# Patient Record
Sex: Male | Born: 2000 | Race: Black or African American | Hispanic: No | Marital: Single | Smoking: Never smoker
Health system: Southern US, Community
[De-identification: ages and names within clinical notes are randomized; demographics above are authoritative.]

---

## 2019-04-17 ENCOUNTER — Emergency Department (HOSPITAL_BASED_OUTPATIENT_CLINIC_OR_DEPARTMENT_OTHER): Payer: Medicaid Other

## 2019-04-17 ENCOUNTER — Other Ambulatory Visit: Payer: Self-pay

## 2019-04-17 ENCOUNTER — Encounter (HOSPITAL_BASED_OUTPATIENT_CLINIC_OR_DEPARTMENT_OTHER): Payer: Self-pay

## 2019-04-17 ENCOUNTER — Emergency Department (HOSPITAL_BASED_OUTPATIENT_CLINIC_OR_DEPARTMENT_OTHER)
Admission: EM | Admit: 2019-04-17 | Discharge: 2019-04-17 | Disposition: A | Discharge status: Home / Self Care | Payer: Medicaid Other | Attending: Emergency Medicine | Admitting: Emergency Medicine

## 2019-04-17 DIAGNOSIS — Y9371 Activity, boxing: Secondary | ICD-10-CM | POA: Diagnosis not present

## 2019-04-17 DIAGNOSIS — Y929 Unspecified place or not applicable: Secondary | ICD-10-CM | POA: Diagnosis not present

## 2019-04-17 DIAGNOSIS — W228XXA Striking against or struck by other objects, initial encounter: Secondary | ICD-10-CM | POA: Insufficient documentation

## 2019-04-17 DIAGNOSIS — S6991XA Unspecified injury of right wrist, hand and finger(s), initial encounter: Secondary | ICD-10-CM | POA: Diagnosis present

## 2019-04-17 DIAGNOSIS — Y998 Other external cause status: Secondary | ICD-10-CM | POA: Diagnosis not present

## 2019-04-17 DIAGNOSIS — S63641A Sprain of metacarpophalangeal joint of right thumb, initial encounter: Secondary | ICD-10-CM | POA: Insufficient documentation
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
## 2019-04-17 NOTE — Discharge Instructions (Signed)
Take ibuprofen 3 times a day with meals.  Do not take other anti-inflammatories at the same time (Advil, Motrin, naproxen, Aleve). You may supplement with Tylenol if you need further pain control. Use ice packs to help with pain.  Return to the emergency room with any new, worsening, concerning symptoms.

## 2019-10-11 ENCOUNTER — Encounter (HOSPITAL_BASED_OUTPATIENT_CLINIC_OR_DEPARTMENT_OTHER): Payer: Self-pay

## 2019-10-11 ENCOUNTER — Emergency Department (HOSPITAL_BASED_OUTPATIENT_CLINIC_OR_DEPARTMENT_OTHER)
Admission: EM | Admit: 2019-10-11 | Discharge: 2019-10-11 | Disposition: A | Discharge status: Home / Self Care | Payer: Medicaid Other | Attending: Emergency Medicine | Admitting: Emergency Medicine

## 2019-10-11 ENCOUNTER — Other Ambulatory Visit: Payer: Self-pay

## 2019-10-11 DIAGNOSIS — H7292 Unspecified perforation of tympanic membrane, left ear: Secondary | ICD-10-CM | POA: Insufficient documentation

## 2019-10-11 DIAGNOSIS — Y9371 Activity, boxing: Secondary | ICD-10-CM | POA: Diagnosis not present

## 2019-10-11 DIAGNOSIS — W500XXA Accidental hit or strike by another person, initial encounter: Secondary | ICD-10-CM | POA: Diagnosis not present

## 2019-10-11 DIAGNOSIS — Y999 Unspecified external cause status: Secondary | ICD-10-CM | POA: Insufficient documentation

## 2019-10-11 DIAGNOSIS — H9202 Otalgia, left ear: Secondary | ICD-10-CM | POA: Diagnosis present

## 2019-10-11 DIAGNOSIS — Y9239 Other specified sports and athletic area as the place of occurrence of the external cause: Secondary | ICD-10-CM | POA: Insufficient documentation

## 2019-10-11 MED ORDER — OFLOXACIN 0.3 % OT SOLN: 5.0000 [drp] | Freq: Two times a day (BID) | OTIC | 0 refills | Status: AC
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
## 2019-10-11 NOTE — Discharge Instructions (Signed)
You were seen in the ED today with an injury to the left eardrum. Please use the antibiotics as prescribed and call the ENT doctor listed to schedule a follow up appointment for early next week. Return to the ED with any new or worsening symptoms.

## 2019-10-11 NOTE — ED Notes (Signed)
ED Provider at bedside. 

## 2019-12-01 ENCOUNTER — Other Ambulatory Visit: Payer: Self-pay

## 2019-12-01 ENCOUNTER — Encounter (HOSPITAL_BASED_OUTPATIENT_CLINIC_OR_DEPARTMENT_OTHER): Payer: Self-pay | Admitting: Emergency Medicine

## 2019-12-01 ENCOUNTER — Emergency Department (HOSPITAL_BASED_OUTPATIENT_CLINIC_OR_DEPARTMENT_OTHER)
Admission: EM | Admit: 2019-12-01 | Discharge: 2019-12-01 | Disposition: A | Discharge status: Home / Self Care | Payer: Medicaid Other | Attending: Emergency Medicine | Admitting: Emergency Medicine

## 2019-12-01 DIAGNOSIS — Z5321 Procedure and treatment not carried out due to patient leaving prior to being seen by health care provider: Secondary | ICD-10-CM | POA: Insufficient documentation

## 2019-12-01 DIAGNOSIS — M79642 Pain in left hand: Secondary | ICD-10-CM | POA: Insufficient documentation
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
## 2019-12-01 NOTE — ED Notes (Signed)
Pt wanting to go home - has not been seen by provider. LWBS after triage. EDP aware, but has not seen patient.

## 2019-12-09 ENCOUNTER — Encounter (HOSPITAL_BASED_OUTPATIENT_CLINIC_OR_DEPARTMENT_OTHER): Payer: Self-pay | Admitting: *Deleted

## 2019-12-09 ENCOUNTER — Emergency Department (HOSPITAL_BASED_OUTPATIENT_CLINIC_OR_DEPARTMENT_OTHER): Payer: Medicaid Other

## 2019-12-09 ENCOUNTER — Emergency Department (HOSPITAL_BASED_OUTPATIENT_CLINIC_OR_DEPARTMENT_OTHER)
Admission: EM | Admit: 2019-12-09 | Discharge: 2019-12-09 | Disposition: A | Discharge status: Home / Self Care | Payer: Medicaid Other | Attending: Emergency Medicine | Admitting: Emergency Medicine

## 2019-12-09 ENCOUNTER — Other Ambulatory Visit: Payer: Self-pay

## 2019-12-09 DIAGNOSIS — S60222A Contusion of left hand, initial encounter: Secondary | ICD-10-CM | POA: Diagnosis not present

## 2019-12-09 DIAGNOSIS — W228XXA Striking against or struck by other objects, initial encounter: Secondary | ICD-10-CM | POA: Diagnosis not present

## 2019-12-09 DIAGNOSIS — Y929 Unspecified place or not applicable: Secondary | ICD-10-CM | POA: Diagnosis not present

## 2019-12-09 DIAGNOSIS — Y999 Unspecified external cause status: Secondary | ICD-10-CM | POA: Insufficient documentation

## 2019-12-09 DIAGNOSIS — Y939 Activity, unspecified: Secondary | ICD-10-CM | POA: Insufficient documentation

## 2019-12-09 DIAGNOSIS — S6992XA Unspecified injury of left wrist, hand and finger(s), initial encounter: Secondary | ICD-10-CM | POA: Diagnosis present

## 2019-12-09 MED ORDER — NAPROXEN 375 MG PO TABS: ORAL_TABLET | ORAL | 0 refills | Status: AC

## 2019-12-09 MED ORDER — NAPROXEN 250 MG PO TABS
500.0000 mg | ORAL_TABLET | Freq: Once | ORAL | Status: AC
  Administered 2019-12-09: 500 mg via ORAL
  Filled 2019-12-09: qty 2
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
## 2020-01-19 ENCOUNTER — Other Ambulatory Visit: Payer: Self-pay

## 2020-01-19 ENCOUNTER — Emergency Department (HOSPITAL_BASED_OUTPATIENT_CLINIC_OR_DEPARTMENT_OTHER): Payer: Medicaid Other

## 2020-01-19 ENCOUNTER — Emergency Department (HOSPITAL_BASED_OUTPATIENT_CLINIC_OR_DEPARTMENT_OTHER)
Admission: EM | Admit: 2020-01-19 | Discharge: 2020-01-19 | Disposition: A | Discharge status: Home / Self Care | Payer: Medicaid Other | Attending: Emergency Medicine | Admitting: Emergency Medicine

## 2020-01-19 ENCOUNTER — Encounter (HOSPITAL_BASED_OUTPATIENT_CLINIC_OR_DEPARTMENT_OTHER): Payer: Self-pay

## 2020-01-19 DIAGNOSIS — M25511 Pain in right shoulder: Secondary | ICD-10-CM | POA: Insufficient documentation

## 2020-01-19 MED ORDER — NAPROXEN 500 MG PO TABS: 500.0000 mg | ORAL_TABLET | Freq: Two times a day (BID) | ORAL | 0 refills | Status: AC

## 2020-01-19 NOTE — Discharge Instructions (Signed)
You were seen today for shoulder pain.  Your x-rays are negative for fracture dislocation.  Apply ice.  Take naproxen for pain.  Make sure to do range of motion exercises.
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
## 2020-07-29 ENCOUNTER — Other Ambulatory Visit: Payer: Self-pay

## 2020-07-29 ENCOUNTER — Emergency Department (HOSPITAL_BASED_OUTPATIENT_CLINIC_OR_DEPARTMENT_OTHER)
Admission: EM | Admit: 2020-07-29 | Discharge: 2020-07-29 | Disposition: A | Discharge status: Home / Self Care | Payer: Medicaid Other | Attending: Emergency Medicine | Admitting: Emergency Medicine

## 2020-07-29 ENCOUNTER — Encounter (HOSPITAL_BASED_OUTPATIENT_CLINIC_OR_DEPARTMENT_OTHER): Payer: Self-pay

## 2020-07-29 ENCOUNTER — Emergency Department (HOSPITAL_BASED_OUTPATIENT_CLINIC_OR_DEPARTMENT_OTHER): Payer: Medicaid Other

## 2020-07-29 DIAGNOSIS — Y9371 Activity, boxing: Secondary | ICD-10-CM | POA: Diagnosis not present

## 2020-07-29 DIAGNOSIS — X58XXXA Exposure to other specified factors, initial encounter: Secondary | ICD-10-CM | POA: Insufficient documentation

## 2020-07-29 DIAGNOSIS — S299XXA Unspecified injury of thorax, initial encounter: Secondary | ICD-10-CM | POA: Insufficient documentation
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
## 2020-07-29 NOTE — Discharge Instructions (Signed)
Get help right away if: You have any of these: Shortness of breath. Pain in your abdomen. Nausea or vomiting. A fever or chills. A rapid heart rate. You cough up blood. You feel dizzy or weak. You faint. You have severe chest pain. This may come with other symptoms, such as: Dizziness. Shortness of breath. Pain in your neck, jaw, or back, or in one arm or both arms.
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
## 2020-08-04 IMAGING — CR DG SHOULDER 2+V*R*
3 series · 3 of 3 positions shown · non-contrast
Comparison: None.

CLINICAL DATA: Pain with motion

EXAM:
RIGHT SHOULDER - 2+ VIEW

[w shoulder grashey right]
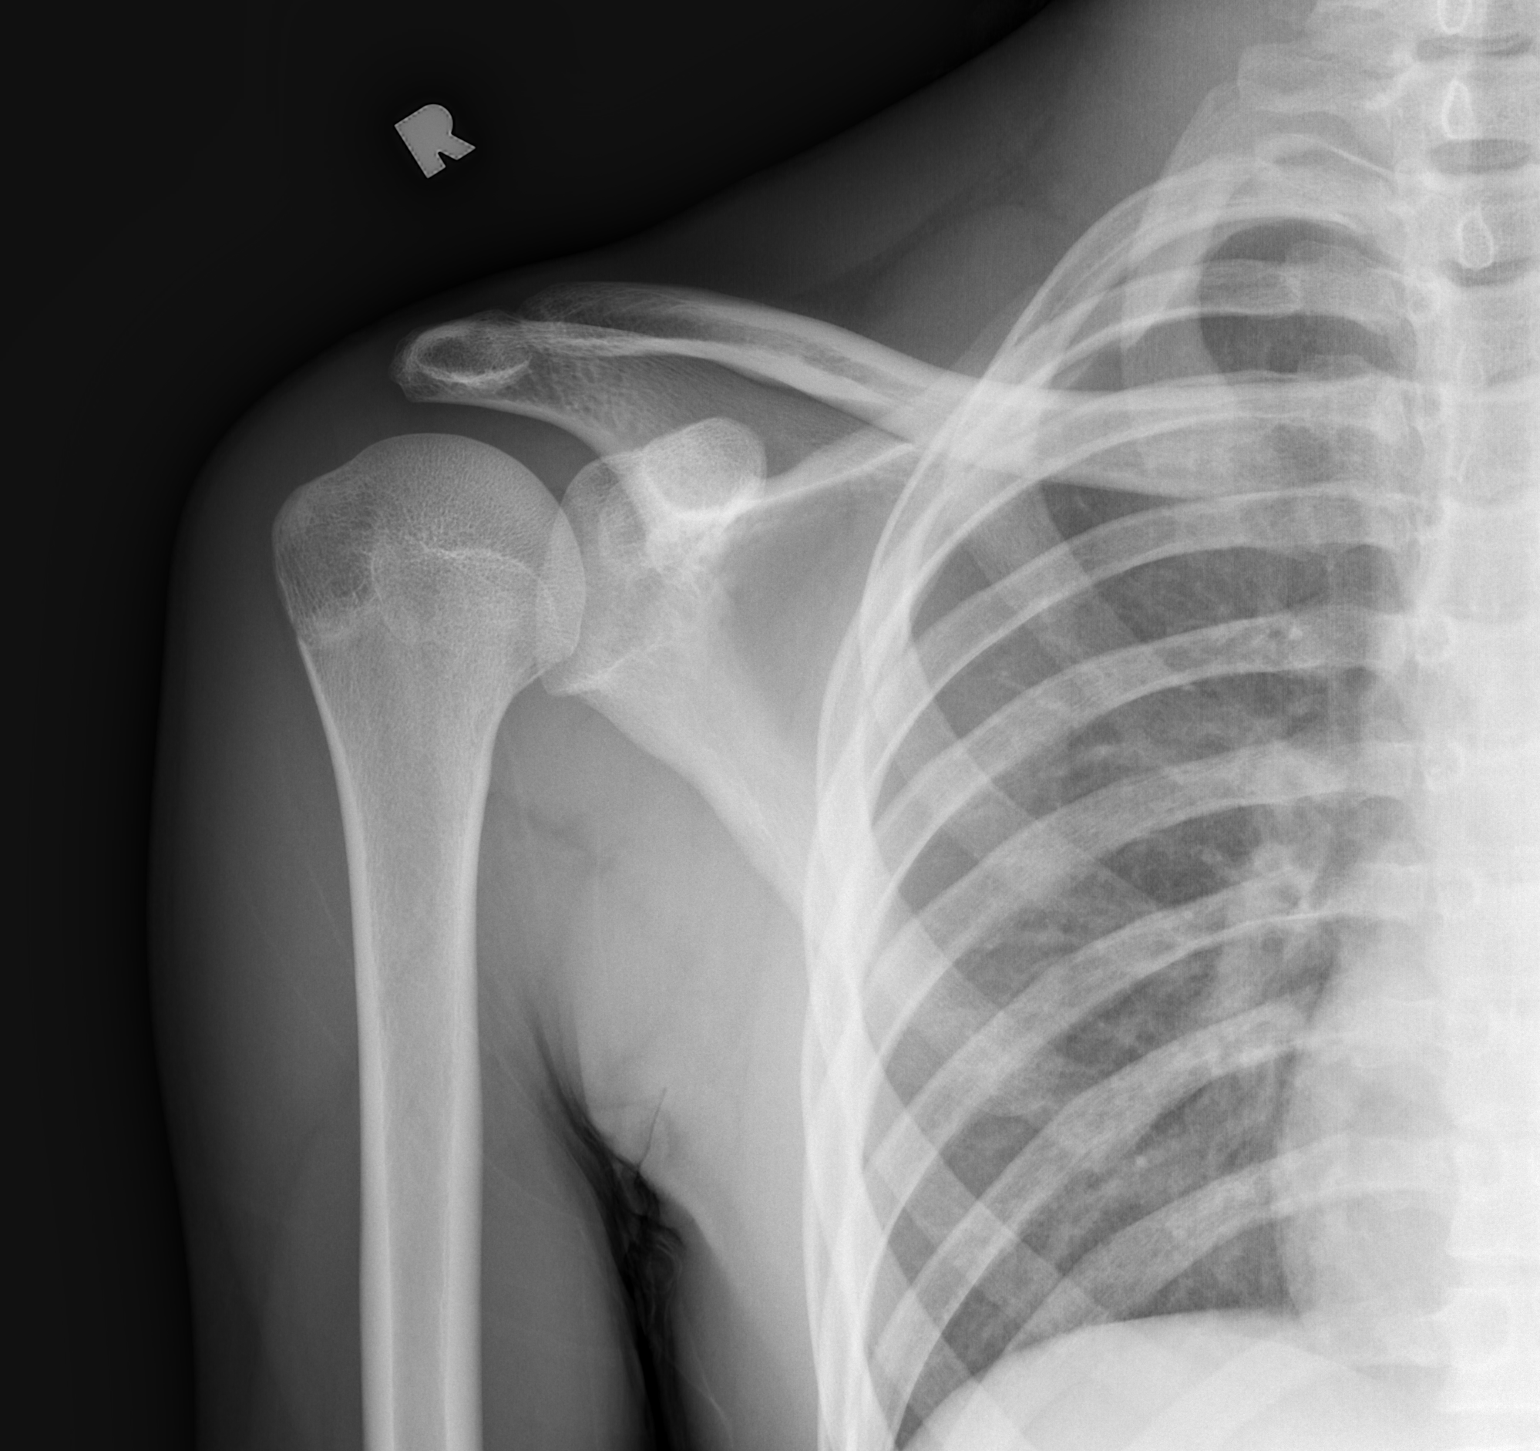

[w shoulder y view right]
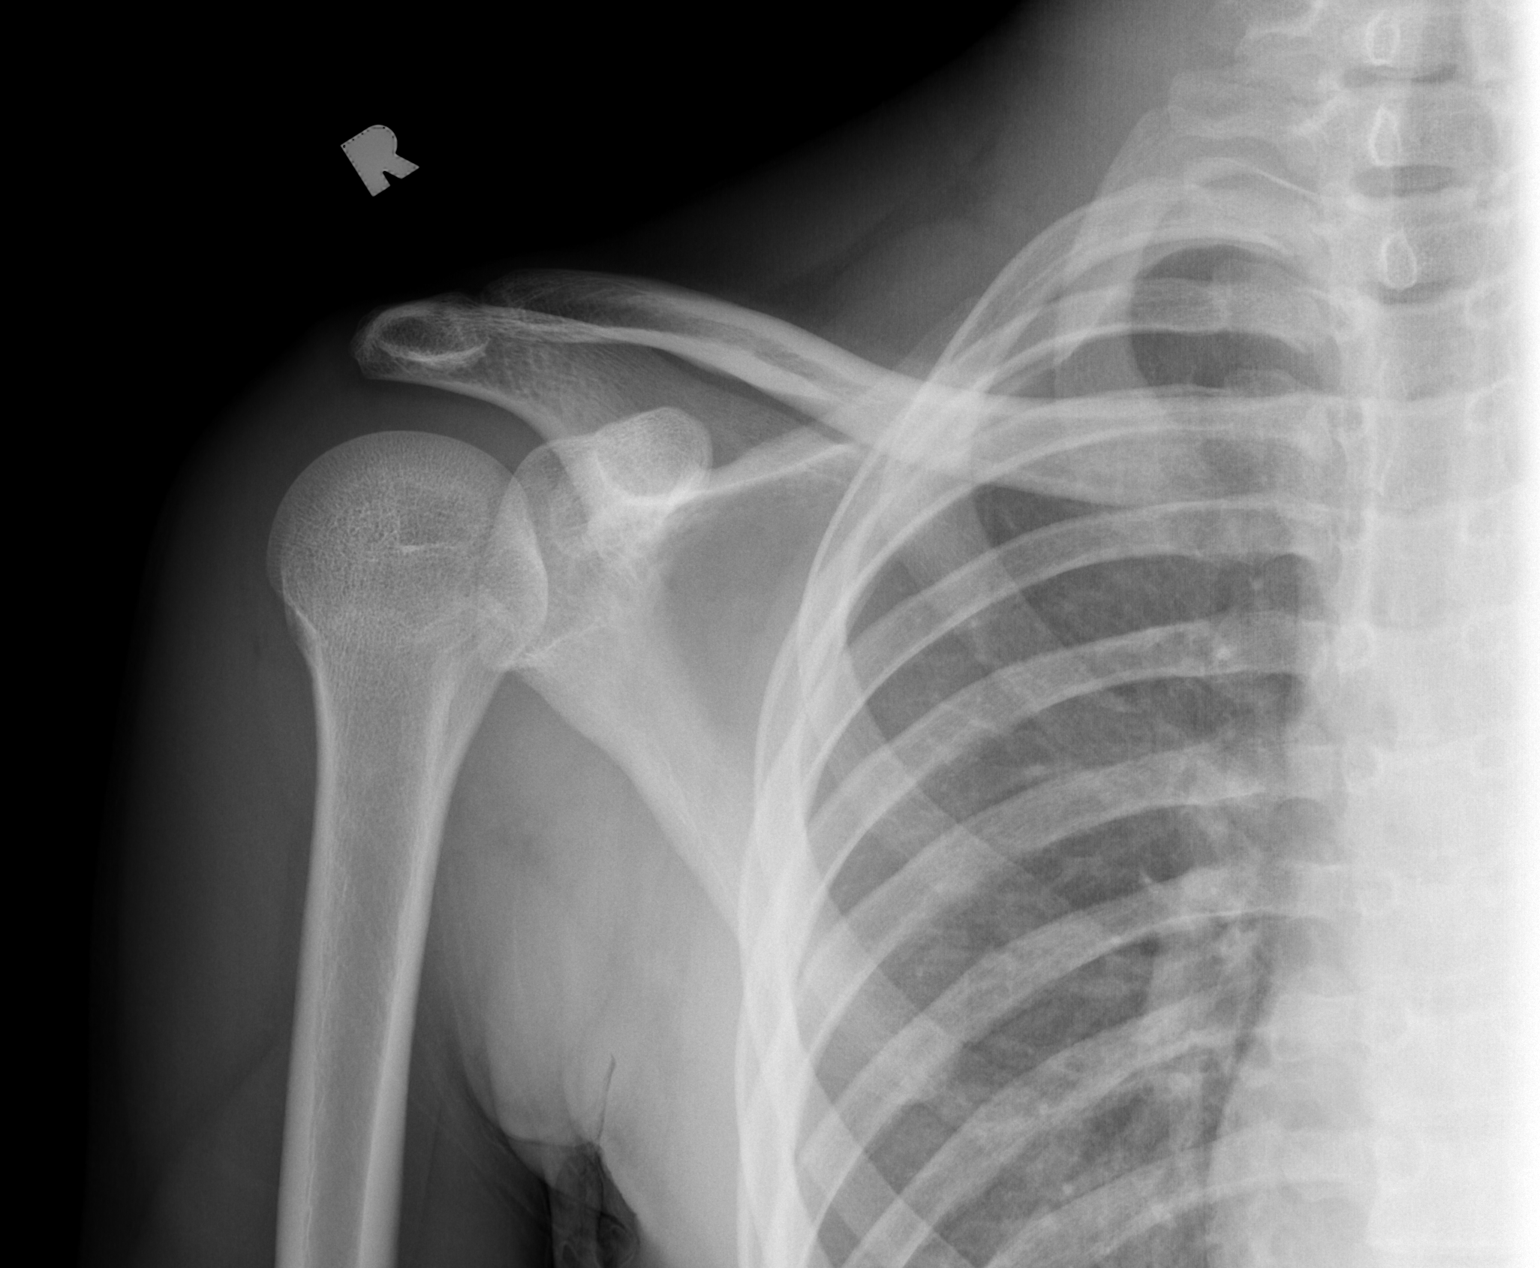

[w shoulder axillary right *]
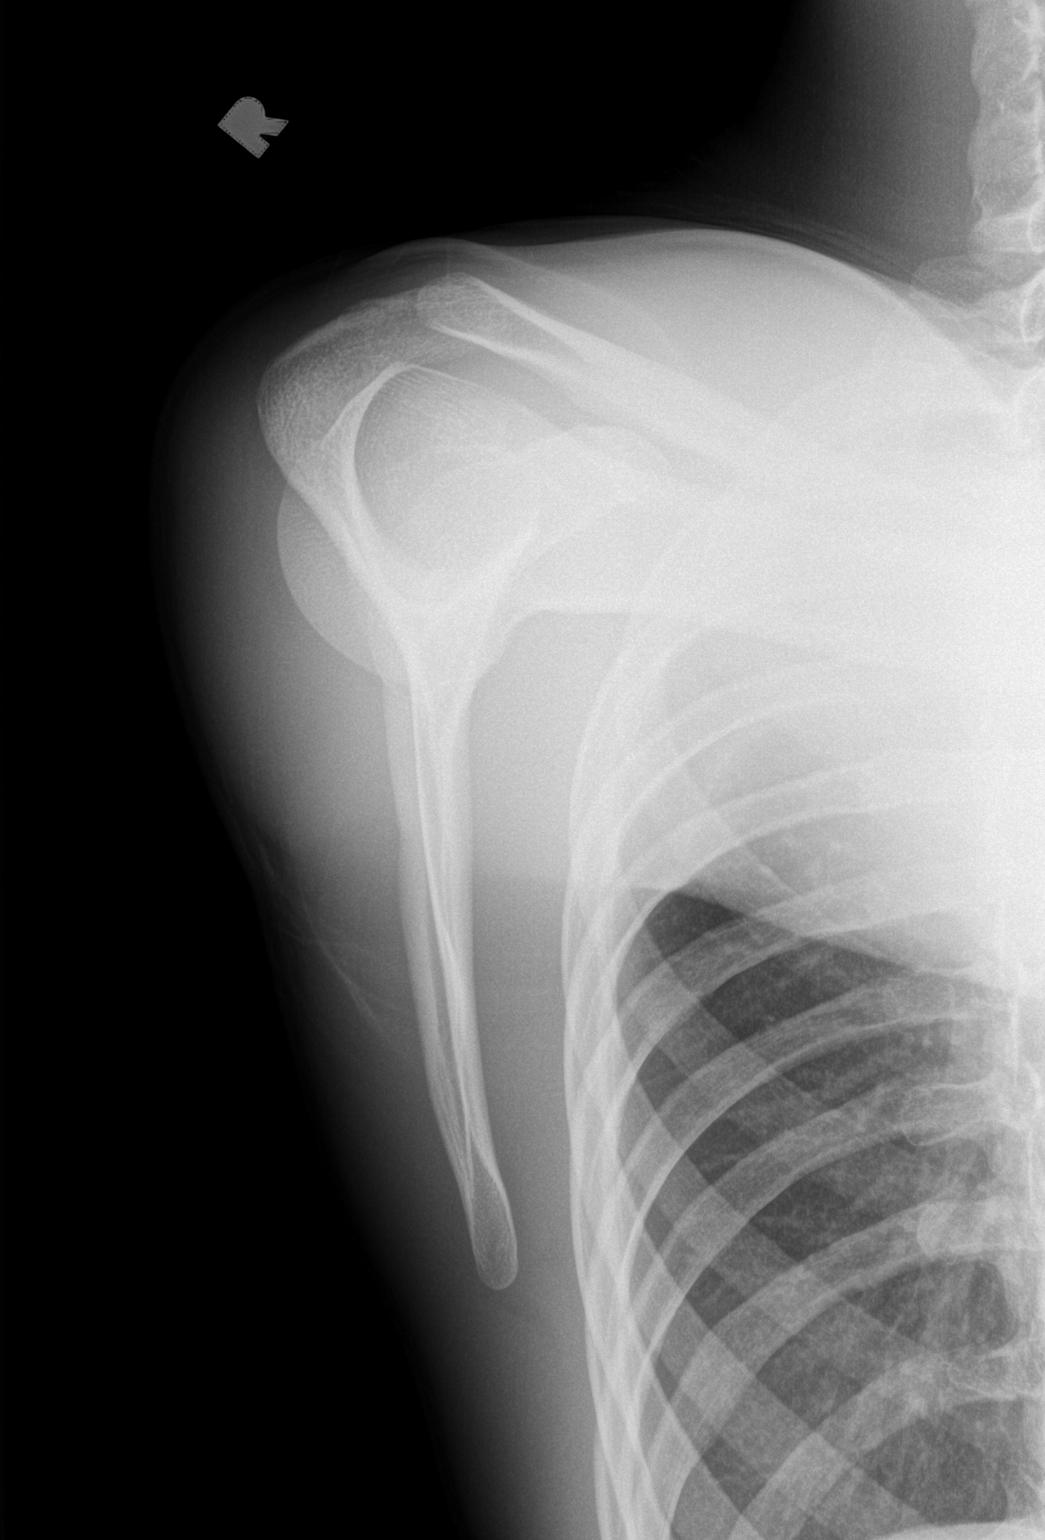

[3 of 3 positions shown; findings below may reference images not displayed]

FINDINGS: There is no evidence of fracture or dislocation. There is no
evidence of arthropathy or other focal bone abnormality. Soft
tissues are unremarkable.
IMPRESSION: Negative.
